# Patient Record
Sex: Female | Born: 1980
Health system: Southern US, Community
[De-identification: ages and names within clinical notes are randomized; demographics above are authoritative.]

## PROBLEM LIST (undated history)

## (undated) DIAGNOSIS — F419 Anxiety disorder, unspecified: Secondary | ICD-10-CM

## (undated) DIAGNOSIS — R51 Headache: Secondary | ICD-10-CM

## (undated) DIAGNOSIS — J45909 Unspecified asthma, uncomplicated: Secondary | ICD-10-CM

## (undated) DIAGNOSIS — T7840XA Allergy, unspecified, initial encounter: Secondary | ICD-10-CM

## (undated) DIAGNOSIS — R519 Headache, unspecified: Secondary | ICD-10-CM

## (undated) HISTORY — DX: Unspecified asthma, uncomplicated: J45.909

## (undated) HISTORY — DX: Headache, unspecified: R51.9

## (undated) HISTORY — DX: Anxiety disorder, unspecified: F41.9

## (undated) HISTORY — DX: Headache: R51

## (undated) HISTORY — DX: Allergy, unspecified, initial encounter: T78.40XA

---
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
## 2000-01-26 ENCOUNTER — Inpatient Hospital Stay (HOSPITAL_COMMUNITY): Admission: AD | Admit: 2000-01-26 | Discharge: 2000-01-26 | Payer: Self-pay | Admitting: Obstetrics and Gynecology

## 2000-02-21 ENCOUNTER — Inpatient Hospital Stay (HOSPITAL_COMMUNITY): Admission: AD | Admit: 2000-02-21 | Discharge: 2000-02-24 | Payer: Self-pay | Admitting: Obstetrics and Gynecology

## 2000-02-25 ENCOUNTER — Encounter: Admission: RE | Admit: 2000-02-25 | Discharge: 2000-03-28 | Payer: Self-pay | Admitting: Obstetrics and Gynecology

## 2000-03-30 ENCOUNTER — Other Ambulatory Visit: Admission: RE | Admit: 2000-03-30 | Discharge: 2000-03-30 | Payer: Self-pay | Admitting: Obstetrics and Gynecology

## 2000-05-06 ENCOUNTER — Encounter: Payer: Self-pay | Admitting: Obstetrics and Gynecology

## 2000-05-06 ENCOUNTER — Ambulatory Visit (HOSPITAL_COMMUNITY): Admission: RE | Admit: 2000-05-06 | Discharge: 2000-05-06 | Payer: Self-pay | Admitting: Obstetrics and Gynecology

## 2001-07-27 ENCOUNTER — Other Ambulatory Visit: Admission: RE | Admit: 2001-07-27 | Discharge: 2001-07-27 | Payer: Self-pay | Admitting: Obstetrics and Gynecology

## 2004-04-03 ENCOUNTER — Other Ambulatory Visit: Admission: RE | Admit: 2004-04-03 | Discharge: 2004-04-03 | Payer: Self-pay | Admitting: Obstetrics and Gynecology

## 2004-11-12 ENCOUNTER — Other Ambulatory Visit: Admission: RE | Admit: 2004-11-12 | Discharge: 2004-11-12 | Payer: Self-pay | Admitting: Obstetrics and Gynecology

## 2005-04-06 ENCOUNTER — Other Ambulatory Visit: Admission: RE | Admit: 2005-04-06 | Discharge: 2005-04-06 | Payer: Self-pay | Admitting: Obstetrics and Gynecology

## 2005-09-17 ENCOUNTER — Other Ambulatory Visit: Admission: RE | Admit: 2005-09-17 | Discharge: 2005-09-17 | Payer: Self-pay | Admitting: Obstetrics and Gynecology

## 2010-02-15 HISTORY — PX: REPAIR PERONEAL TENDONS ANKLE: SUR1201

## 2010-07-21 ENCOUNTER — Ambulatory Visit: Payer: Self-pay | Admitting: Family Medicine

## 2010-11-19 ENCOUNTER — Ambulatory Visit (HOSPITAL_BASED_OUTPATIENT_CLINIC_OR_DEPARTMENT_OTHER)
Admission: RE | Admit: 2010-11-19 | Discharge: 2010-11-19 | Disposition: A | Discharge status: Home / Self Care | Payer: 59 | Source: Ambulatory Visit | Attending: Orthopedic Surgery | Admitting: Orthopedic Surgery

## 2010-11-19 DIAGNOSIS — Z01812 Encounter for preprocedural laboratory examination: Secondary | ICD-10-CM | POA: Insufficient documentation

## 2010-11-19 DIAGNOSIS — J449 Chronic obstructive pulmonary disease, unspecified: Secondary | ICD-10-CM | POA: Insufficient documentation

## 2010-11-19 DIAGNOSIS — M659 Synovitis and tenosynovitis, unspecified: Secondary | ICD-10-CM | POA: Insufficient documentation

## 2010-11-19 DIAGNOSIS — M24176 Other articular cartilage disorders, unspecified foot: Secondary | ICD-10-CM | POA: Insufficient documentation

## 2010-11-19 DIAGNOSIS — Z87891 Personal history of nicotine dependence: Secondary | ICD-10-CM | POA: Insufficient documentation

## 2010-11-19 DIAGNOSIS — M65979 Unspecified synovitis and tenosynovitis, unspecified ankle and foot: Secondary | ICD-10-CM | POA: Insufficient documentation

## 2010-11-19 DIAGNOSIS — M249 Joint derangement, unspecified: Secondary | ICD-10-CM | POA: Insufficient documentation

## 2010-11-19 DIAGNOSIS — J4489 Other specified chronic obstructive pulmonary disease: Secondary | ICD-10-CM | POA: Insufficient documentation

## 2010-11-19 LAB — POCT HEMOGLOBIN-HEMACUE: Hemoglobin: 13 g/dL (ref 12.0–15.0)

## 2010-11-23 NOTE — Op Note (Signed)
Gabriela Alvarado, Gabriela Alvarado                ACCOUNT NO.:  [id number]  MEDICAL RECORD NO.:  [id number]  LOCATION:                                 FACILITY:  PHYSICIAN:  Toni Arthurs, MD             DATE OF BIRTH:  DATE OF PROCEDURE:  11/19/2010 DATE OF DISCHARGE:                              OPERATIVE REPORT   PREOPERATIVE DIAGNOSIS:  Left peroneal tendon tear.  POSTOPERATIVE DIAGNOSES: 1. Left ankle peroneus brevis tendon tear. 2. Left peroneus brevis and peroneus longus tenosynovitis. 3. Left peroneus quartus. 4. Prominent left peroneal tubercle.  PROCEDURES: 1. Left peroneus longus tenolysis. 2. Left peroneus brevis tenolysis. 3. Left peroneus brevis tendon repair. 4. Exostectomy of the left peroneal tubercle from the lateral wall of     the calcaneus. 5. Transfer of peroneus quartus to peroneus longus.  SURGEON:  Toni Arthurs, MD  ANESTHESIA:  General, regional.  IV FLUIDS:  See anesthesia record.  ESTIMATED BLOOD LOSS:  Minimal.  TOURNIQUET TIME:  Fifty seven minutes at 250 mmHg.  COMPLICATIONS:  None apparent.  DISPOSITION:  Extubated awake and stable to recovery.  INDICATIONS FOR PROCEDURE:  The patient is a 30 year old female who complains of left lateral ankle pain for more than a year.  She has failed treatment with physical therapy, activity modification, shoe wear modification, bracing.  Signs and symptoms are consistent with a peroneal tendon tear.  She presents now for operative treatment of this condition.  She understands the risks and benefits of this procedure as well as the  alternative treatment options and would like to proceed. Specifically, she understands risks of bleeding, infection, nerve damage, blood clots, need for additional surgery, amputation, and death.  PROCEDURE IN DETAIL:  After preoperative consent was obtained and the correct operative site was identified, the patient was brought to the operating room and placed supine on the operating  table.  General anesthesia was induced.  Preoperative antibiotics were administered. Surgical time-out was taken.  The patient was then turned in the lateral decubitus position with the left side up.  The left lower extremity was prepped and draped in standard sterile fashion with a tourniquet around the thigh.  An incision was marked on the skin overlying the peroneal tendon sheath.  The extremity was exsanguinated and the tourniquet was inflated to 250 mmHg.  The previously marked incision was made and sharp dissection was carried down through the skin and subcutaneous tissue, taking care to protect the crossing branches of the sural nerve.  The peroneal tendon sheath was incised and copious synovial fluid was immediately evident.  Both the peroneus brevis and peroneus longus tendons were noted to have significant tenosynovitis along their full length.  Also present was a very prominent peroneal tubercle off the lateral wall of the calcaneus.  The peroneus brevis was noted to have several degenerative longitudinal split tears in the central portion of the tendon.  Also noted posteriorly was a peroneus quartus.  This did not have a particularly low-lying muscle belly, though.  The synovial fluid was all cleaned out.  All the tenosynovitis was sharply excised along the tendons proximally and distally.  The degenerated portion of the peroneus brevis tendon was excised sharply.  The remaining healthy fibers were repaired side-to-side with buried horizontal mattress sutures of 2-0 Vicryl.  Running 2-0 Vicryl sutures were then used to close the splits in the tendon anteriorly and posteriorly.  The retrofibular groove was noted to be of appropriate depth and there was no evidence of subluxation or dislocation of the peroneal tendons.  The prominent peroneal tubercle was excised with a rongeur.  The peroneus quartus tendon was detached from its insertion on the posterior tendon sheath and  transferred to the peroneus longus tendon with a Pulvertaft weave.  The wound was irrigated copiously.  Three drill holes were made in the posterior aspect of the edge of the fibula.  3-0 Ethibond sutures were then passed through the drill holes repairing the superior peroneal retinaculum back to its original position on the fibula.  The periosteum was then repaired over the drill holes to the retinaculum with simple sutures of 2-0 Vicryl.  The remainder of the peroneal tendon sheath was closed in watertight fashion with 2-0 Vicryl sutures.  Subcutaneous tissue was closed with inverted simple sutures of 4-0 Monocryl.  Skin was closed with a running 3-0 Prolene suture.  Sterile dressings were applied followed by a well-padded short-leg cast.  The tourniquet was released at 59 minutes.  The patient was then awakened from anesthesia and transported to the recovery room in stable condition.  FOLLOWUP PLAN:  The patient will be nonweightbearing on her left lower extremity for 2 weeks.  She will follow up with me at that point for suture removal and conversion to either a walking cast or a Cam walker boot.     Toni Arthurs, MD     JH/MEDQ  D:  11/19/2010  T:  11/19/2010  Job:  [id number]  Electronically Signed by Jonny Ruiz Seriyah Collison  on 11/23/2010 08:56:08 AM

## 2011-07-14 ENCOUNTER — Ambulatory Visit (INDEPENDENT_AMBULATORY_CARE_PROVIDER_SITE_OTHER): Payer: 59 | Admitting: Physician Assistant

## 2011-07-14 ENCOUNTER — Telehealth: Payer: Self-pay

## 2011-07-14 VITALS — BP 110/64 | HR 72 | Temp 98.3°F | Resp 18 | Ht 71.5 in | Wt 197.0 lb

## 2011-07-14 DIAGNOSIS — R300 Dysuria: Secondary | ICD-10-CM

## 2011-07-14 DIAGNOSIS — N390 Urinary tract infection, site not specified: Secondary | ICD-10-CM

## 2011-07-14 DIAGNOSIS — R350 Frequency of micturition: Secondary | ICD-10-CM

## 2011-07-14 LAB — POCT UA - MICROSCOPIC ONLY
Casts, Ur, LPF, POC: NEGATIVE
Crystals, Ur, HPF, POC: NEGATIVE
Mucus, UA: NEGATIVE
Yeast, UA: NEGATIVE

## 2011-07-14 LAB — POCT URINALYSIS DIPSTICK
Bilirubin, UA: NEGATIVE
Glucose, UA: NEGATIVE
Nitrite, UA: NEGATIVE

## 2011-07-14 MED ORDER — PHENAZOPYRIDINE HCL 100 MG PO TABS: 100.0000 mg | ORAL_TABLET | Freq: Three times a day (TID) | ORAL | Status: AC | PRN

## 2011-07-14 MED ORDER — NITROFURANTOIN MONOHYD MACRO 100 MG PO CAPS: 100.0000 mg | ORAL_CAPSULE | Freq: Two times a day (BID) | ORAL | Status: AC

## 2011-07-14 NOTE — Telephone Encounter (Signed)
Pt calling for lab results. States they were taken by urine sample. Pt anxious.  Best: [phone]  bf

## 2011-07-14 NOTE — Progress Notes (Signed)
Patient ID: Gabriela Alvarado MRN: [id number], DOB: 06-Jun-1980, 31 y.o. Date of Encounter: 07/14/2011, 10:13 AM  Primary Physician: No primary provider on file.  Chief Complaint: Dysuria, urinary frequency and urgency  HPI: 31 y.o. year old female with history below presents with a one day history of dysuria, urinary frequency, and urinary urgency. Afebrile. No chills. No nausea or vomiting. No flank or low back pain. Mild suprapubic fullness, otherwise no abdominal pain. History of Chlamydia several years prior, successfully treated. Sexually active. New female partner one week prior. No condom use. No vaginal complaints. LMP 10 years prior, secondary to contraception.    No past medical history on file.   Home Meds: Prior to Admission medications   Medication Sig Start Date End Date Taking? Authorizing Provider  escitalopram (LEXAPRO) 10 MG tablet Take 10 mg by mouth daily.   Yes Historical Provider, MD  levonorgestrel (MIRENA) 20 MCG/24HR IUD 1 each by Intrauterine route once.   Yes Historical Provider, MD                  Allergies:  Allergies  Allergen Reactions  . Antihistamines, Chlorpheniramine-Type     History   Social History  . Marital Status: Single    Spouse Name: N/A    Number of Children: N/A  . Years of Education: N/A   Occupational History  . Not on file.   Social History Main Topics  . Smoking status: Current Everyday Smoker  . Smokeless tobacco: Not on file  . Alcohol Use: Not on file  . Drug Use: Not on file  . Sexually Active: Not on file   Other Topics Concern  . Not on file   Social History Narrative  . No narrative on file     Review of Systems: Constitutional: negative for chills, fever, night sweats, weight changes, or fatigue  HEENT: negative for vision changes or hearing loss Cardiovascular: negative for chest pain or palpitations Respiratory: negative for hemoptysis, wheezing, shortness of breath, or cough Abdominal: negative for  abdominal pain, nausea, vomiting, diarrhea, or constipation Genitourinary: negative for abnormal vaginal bleeding, discharge, burning, pruritis, menopause symptoms, or pelvic pain Dermatological: negative for rash Neurologic: negative for headache, dizziness, or syncope All other systems reviewed and are otherwise negative with the exception to those above and in the HPI.   Physical Exam: Blood pressure 110/64, pulse 72, temperature 98.3 F (36.8 C), temperature source Oral, resp. rate 18, height 5' 11.5" (1.816 m), weight 197 lb (89.359 kg), last menstrual period 07/13/2001., Body mass index is 27.09 kg/(m^2). General: Well developed, well nourished, in no acute distress. Head: Normocephalic, atraumatic, eyes without discharge, sclera non-icteric, nares are without discharge. Bilateral auditory canals clear, TM's are without perforation, pearly grey and translucent with reflective cone of light bilaterally. Oral cavity moist, posterior pharynx without exudate, erythema, peritonsillar abscess, or post nasal drip.  Neck: Supple. No thyromegaly. Full ROM. No lymphadenopathy. Lungs: Clear bilaterally to auscultation without wheezes, rales, or rhonchi. Breathing is unlabored. Heart: RRR with S1 S2. No murmurs, rubs, or gallops appreciated. Abdomen: Soft, non-tender, non-distended with normoactive bowel sounds. Mild suprapubic fullness to palpation, creates sensation of needing to void. No hepatosplenomegaly. No rebound/guarding. No obvious abdominal masses. No CVA tenderness. Negative McBurney's, Rovsing's, Iliopsoas, and table jar test. Msk:  Strength and tone normal for age. Extremities/Skin: Warm and dry. No clubbing or cyanosis. No edema. No rashes or suspicious lesions. Neuro: Alert and oriented X 3. Moves all extremities spontaneously. Gait is normal. CNII-XII  grossly in tact. Psych:  Responds to questions appropriately with a normal affect.   Labs: Results for orders placed in visit on  07/14/11  POCT UA - MICROSCOPIC ONLY      Component Value Range   WBC, Ur, HPF, POC 4-10     RBC, urine, microscopic 2-6     Bacteria, U Microscopic 1+     Mucus, UA neg     Epithelial cells, urine per micros 1-3     Crystals, Ur, HPF, POC neg     Casts, Ur, LPF, POC neg     Yeast, UA neg    POCT URINALYSIS DIPSTICK      Component Value Range   Color, UA yellow     Clarity, UA sl cloudy     Glucose, UA neg     Bilirubin, UA neg     Ketones, UA neg     Spec Grav, UA 1.015     Blood, UA small     pH, UA 7.0     Protein, UA neg     Urobilinogen, UA 0.2     Nitrite, UA neg     Leukocytes, UA moderate (2+)    POCT URINE PREGNANCY      Component Value Range   Preg Test, Ur Negative     UC and uriprobe pending Declines full STD cehck  ASSESSMENT AND PLAN:  31 y.o. year old female with UTI. -Macrobid 100mg  1 po bid #14 no RF -Pyridium 100 mg #6 1 po tid prn no RF SED -Push fluids -Await culture results -Await uriprobe results, okay to leave detailed message -RTC precautions  Signed, Eula Listen, PA-C 07/14/2011 10:13 AM

## 2011-07-15 LAB — URINE CULTURE
Colony Count: NO GROWTH
Organism ID, Bacteria: NO GROWTH

## 2011-07-15 LAB — GC/CHLAMYDIA PROBE AMP, URINE: Chlamydia, Swab/Urine, PCR: NEGATIVE

## 2011-07-17 NOTE — Telephone Encounter (Signed)
Pt given results 07/16/11

## 2013-12-11 ENCOUNTER — Ambulatory Visit (INDEPENDENT_AMBULATORY_CARE_PROVIDER_SITE_OTHER): Payer: BC Managed Care – PPO | Admitting: Family Medicine

## 2013-12-11 VITALS — BP 122/80 | HR 117 | Temp 98.3°F | Resp 16 | Ht 72.0 in | Wt 222.0 lb

## 2013-12-11 DIAGNOSIS — R059 Cough, unspecified: Secondary | ICD-10-CM

## 2013-12-11 DIAGNOSIS — J0100 Acute maxillary sinusitis, unspecified: Secondary | ICD-10-CM

## 2013-12-11 DIAGNOSIS — R05 Cough: Secondary | ICD-10-CM

## 2013-12-11 DIAGNOSIS — J209 Acute bronchitis, unspecified: Secondary | ICD-10-CM

## 2013-12-11 MED ORDER — AZITHROMYCIN 250 MG PO TABS: ORAL_TABLET | ORAL | Status: DC

## 2013-12-11 MED ORDER — HYDROCODONE-HOMATROPINE 5-1.5 MG/5ML PO SYRP
5.0000 mL | ORAL_SOLUTION | Freq: Three times a day (TID) | ORAL | Status: DC | PRN
Start: 2013-12-11 — End: 2013-12-30

## 2013-12-11 NOTE — Progress Notes (Signed)
Patient ID: Gabriela Alvarado, female   DOB: 1980-11-13, 33 y.o.   MRN: [id number]  This chart was scribed for Elvina SidleKurt Keimani Laufer, MD by Charline BillsEssence Howell, ED Scribe. The patient was seen in room 3. Patient's care was started at 2:23 PM.  Patient ID: Gabriela Alvarado MRN: [id number], DOB: 1980-11-13, 33 y.o. Date of Encounter: 12/11/2013, 2:23 PM  Primary Physician: No primary provider on file.  Chief Complaint  Patient presents with  . Headache    x 1 week  . Cough    congestion x 1 week  . Generalized Body Aches    x 1 weel  . Fatigue    x 1 week   HPI: 33 y.o. year old female with history below presents with cough onset 1 week ago. She reports associated congestion, generalized body aches, fatigue and HA onset 1 week ago, ear pain at night. She reports ST that has resolved. She denies fever, night sweats, SOB. No h/o asthma. No inhaler use. No sick contacts. Pt is a smoker; she quit for 10 months but started smoking again 1 month ago.   Immunizations Pt had flu vaccine at work a few weeks ago.  Pt works at [employer].   Past Medical History  Diagnosis Date  . Allergy   . Asthma      Home Meds: Prior to Admission medications   Medication Sig Start Date End Date Taking? Authorizing Provider  escitalopram (LEXAPRO) 10 MG tablet Take 10 mg by mouth daily.   Yes Historical Provider, MD  levonorgestrel (MIRENA) 20 MCG/24HR IUD 1 each by Intrauterine route once.   Yes Historical Provider, MD    Allergies:  Allergies  Allergen Reactions  . Antihistamines, Chlorpheniramine-Type     History   Social History  . Marital Status: Single    Spouse Name: N/A    Number of Children: N/A  . Years of Education: N/A   Occupational History  . Not on file.   Social History Main Topics  . Smoking status: Current Every Day Smoker  . Smokeless tobacco: Not on file  . Alcohol Use: Not on file  . Drug Use: Not on file  . Sexual Activity: Not on file   Other Topics Concern  . Not on file    Social History Narrative  . No narrative on file     Review of Systems: Constitutional: negative for fever, night sweats, chills, or weight changes, +fatigue  HEENT: negative for vision changes, hearing loss, rhinorrhea, epistaxis, or sinus pressure, +congestion, +ST Cardiovascular: negative for chest pain or palpitations Respiratory: negative for shortness of breath, hemoptysis, wheezing, +cough Abdominal: negative for abdominal pain, nausea, vomiting, diarrhea, or constipation Msk: +myalgias Dermatological: negative for rash Neurologic: negative for dizziness, or syncope, +headache All other systems reviewed and are otherwise negative with the exception to those above and in the HPI.  Physical Exam: Triage Vitals: Blood pressure 122/80, pulse 117, temperature 98.3 F (36.8 C), temperature source Oral, resp. rate 16, height 6' (1.829 m), weight 222 lb (100.699 kg), SpO2 99.00%., Body mass index is 30.1 kg/(m^2). General: Well developed, well nourished, in no acute distress. Head: Normocephalic, atraumatic, eyes without discharge, sclera non-icteric, nares are without discharge. Bilateral auditory canals clear, TM's are without perforation, pearly grey and translucent with reflective cone of light bilaterally. Oral cavity moist, posterior pharynx without exudate, erythema, peritonsillar abscess, or post nasal drip.  Neck: Supple. No thyromegaly. Full ROM. No lymphadenopathy. Lungs: No wheezes or rhonchi. Few basilar rales. Heart: RRR  with S1 S2. No murmurs, rubs, or gallops appreciated. Abdomen: Soft, non-tender, non-distended with normoactive bowel sounds. No hepatomegaly. No rebound/guarding. No obvious abdominal masses. Msk:  Strength and tone normal for age. Extremities/Skin: Warm and dry. No clubbing or cyanosis. No edema. No rashes or suspicious lesions. Neuro: Alert and oriented X 3. Moves all extremities spontaneously. Gait is normal. CNII-XII grossly in tact. Psych:  Responds  to questions appropriately with a normal affect.     ASSESSMENT AND PLAN:  33 y.o. year old female with  Cough - Plan: HYDROcodone-homatropine (HYCODAN) 5-1.5 MG/5ML syrup  Acute bronchitis, unspecified organism - Plan: azithromycin (ZITHROMAX Z-PAK) 250 MG tablet  Acute maxillary sinusitis, recurrence not specified - Plan: azithromycin (ZITHROMAX Z-PAK) 250 MG tablet   I personally performed the services described in this documentation, which was scribed in my presence. The recorded information has been reviewed and is accurate.  Signed, Elvina SidleKurt Joselyne Spake, MD 12/11/2013 2:23 PM

## 2013-12-11 NOTE — Patient Instructions (Signed)

## 2013-12-30 ENCOUNTER — Ambulatory Visit (INDEPENDENT_AMBULATORY_CARE_PROVIDER_SITE_OTHER): Payer: BC Managed Care – PPO | Admitting: Family Medicine

## 2013-12-30 VITALS — BP 100/70 | HR 105 | Temp 98.4°F | Resp 16 | Ht 72.0 in | Wt 223.4 lb

## 2013-12-30 DIAGNOSIS — R350 Frequency of micturition: Secondary | ICD-10-CM

## 2013-12-30 DIAGNOSIS — N3001 Acute cystitis with hematuria: Secondary | ICD-10-CM

## 2013-12-30 LAB — POCT UA - MICROSCOPIC ONLY
Casts, Ur, LPF, POC: NEGATIVE
Crystals, Ur, HPF, POC: NEGATIVE
Mucus, UA: NEGATIVE
Yeast, UA: NEGATIVE

## 2013-12-30 LAB — POCT URINALYSIS DIPSTICK
GLUCOSE UA: 100
NITRITE UA: POSITIVE
Protein, UA: 100
Spec Grav, UA: >=1.03
UROBILINOGEN UA: 1
pH, UA: 5

## 2013-12-30 MED ORDER — NITROFURANTOIN MONOHYD MACRO 100 MG PO CAPS: 100.0000 mg | ORAL_CAPSULE | Freq: Two times a day (BID) | ORAL | Status: DC

## 2013-12-30 NOTE — Patient Instructions (Signed)

## 2013-12-30 NOTE — Progress Notes (Signed)
Subjective:    Patient ID: Gabriela Alvarado, female    DOB: 1980-09-25, 33 y.o.   MRN: [id number] This chart was scribed for Norberto SorensonEva Brenan Modesto, MD by [place]il[place]Jordan Peace, ED Scribe. The patient was seen in RM09. The patient's care was started at 4:22 PM.   HPI HPI Comments: Gabriela Alvarado is a 33 y.o. female who [facility] complaining of UTI symptoms onset Friday with increased frequency and dysuria. Pt states that she hasn't had any UTI's recently but reports normal history of them in the past. She denies any recent douching or bubble bath. No complaints of back pain, vaginal discharge, fever, or chills.    Past Medical History  Diagnosis Date  . Allergy   . Asthma    Current Outpatient Prescriptions on File Prior to Visit  Medication Sig Dispense Refill  . escitalopram (LEXAPRO) 10 MG tablet Take 10 mg by mouth daily.    Marland Kitchen. levonorgestrel (MIRENA) 20 MCG/24HR IUD 1 each by Intrauterine route once.     No current facility-administered medications on file prior to visit.   Allergies  Allergen Reactions  . Antihistamines, Chlorpheniramine-Type      Review of Systems  Constitutional: Negative for fever and chills.  Genitourinary: Positive for dysuria and frequency. Negative for vaginal discharge.  Musculoskeletal: Negative for back pain.       Objective:  Physical Exam  Constitutional: She is oriented to person, place, and time. She appears well-developed and well-nourished. No distress.  HENT:  Head: Normocephalic and atraumatic.  Eyes: Conjunctivae and EOM are normal.  Neck: Neck supple. No tracheal deviation present.  Cardiovascular: Normal rate, regular rhythm and normal heart sounds.   Pulmonary/Chest: Effort normal and breath sounds normal. No respiratory distress.  Abdominal: Soft. Bowel sounds are normal. There is no CVA tenderness.  Musculoskeletal: Normal range of motion.  Neurological: She is alert and oriented to person, place, and time.  Skin: Skin is warm and dry.   Psychiatric: She has a normal mood and affect. Her behavior is normal.  Nursing note and vitals reviewed.  BP 100/70 mmHg  Pulse 105  Temp(Src) 98.4 F (36.9 C) (Oral)  Resp 16  Ht 6' (1.829 m)  Wt 223 lb 6 oz (101.322 kg)  BMI 30.29 kg/m2  SpO2 99%   Results for orders placed or performed in visit on 12/30/13  POCT urinalysis dipstick  Result Value Ref Range   Color, UA orange    Clarity, UA clear    Glucose, UA 100    Bilirubin, UA small    Ketones, UA trace    Spec Grav, UA >=1.030    Blood, UA moderate    pH, UA 5.0    Protein, UA 100    Urobilinogen, UA 1.0    Nitrite, UA positive    Leukocytes, UA Trace   POCT UA - Microscopic Only  Result Value Ref Range   WBC, Ur, HPF, POC 25-30    RBC, urine, microscopic tntc    Bacteria, U Microscopic 1+    Mucus, UA neg    Epithelial cells, urine per micros tntc    Crystals, Ur, HPF, POC neg    Casts, Ur, LPF, POC neg    Yeast, UA neg         Assessment & Plan:   Urinary frequency - Plan: POCT urinalysis dipstick, POCT UA - Microscopic Only  Acute cystitis with hematuria - Plan: Urine culture  Meds ordered this encounter  Medications  .  nitrofurantoin, macrocrystal-monohydrate, (MACROBID) 100 MG capsule    Sig: Take 1 capsule (100 mg total) by mouth 2 (two) times daily.    Dispense:  14 capsule    Refill:  0    I personally performed the services described in this documentation, which was scribed in my presence. The recorded information has been reviewed and considered, and addended by me as needed.  Norberto SorensonEva Kydan Shanholtzer, MD MPH

## 2014-01-02 LAB — URINE CULTURE

## 2014-01-16 ENCOUNTER — Ambulatory Visit (INDEPENDENT_AMBULATORY_CARE_PROVIDER_SITE_OTHER): Payer: BC Managed Care – PPO | Admitting: Family Medicine

## 2014-01-16 VITALS — BP 114/70 | HR 98 | Temp 98.4°F | Resp 16 | Ht 71.5 in | Wt 220.0 lb

## 2014-01-16 DIAGNOSIS — R309 Painful micturition, unspecified: Secondary | ICD-10-CM

## 2014-01-16 LAB — POCT URINALYSIS DIPSTICK
Bilirubin, UA: NEGATIVE
GLUCOSE UA: NEGATIVE
KETONES UA: NEGATIVE
Nitrite, UA: NEGATIVE
Protein, UA: NEGATIVE
SPEC GRAV UA: 1.015
UROBILINOGEN UA: 0.2
pH, UA: 5.5

## 2014-01-16 LAB — POCT UA - MICROSCOPIC ONLY
Casts, Ur, LPF, POC: NEGATIVE
Crystals, Ur, HPF, POC: NEGATIVE
Mucus, UA: NEGATIVE
Yeast, UA: NEGATIVE

## 2014-01-16 MED ORDER — CIPROFLOXACIN HCL 500 MG PO TABS
500.0000 mg | ORAL_TABLET | Freq: Two times a day (BID) | ORAL | Status: DC
Start: 2014-01-16 — End: 2016-07-23

## 2014-01-16 NOTE — Progress Notes (Addendum)
This chart was scribed for Gabriela SidleKurt Lauenstein, MD by Tonye RoyaltyJoshua Chen, ED Scribe.  Patient ID: Gabriela Alvarado MRN: [id number], DOB: [birth year], 33 y.o. Date of Encounter: 01/16/2014, 1:58 PM  Primary Physician: No PCP Per Patient  Chief Complaint: dysuria  HPI: 33 y.o. year old female with history below for dysuria with onset 1 week ago. She was evaluate here for UTI 2 weeks ago, but states her symptoms now seem different. She states that she had dysuria and frequency from which she improved and was asymptomatic for 3 days, but then started having dysuria again. She states she has back pain as well but believes it is due to her period. She denies hematuria.   Past Medical History  Diagnosis Date   Allergy    Asthma      Home Meds: Prior to Admission medications   Medication Sig Start Date End Date Taking? Authorizing Provider  escitalopram (LEXAPRO) 10 MG tablet Take 10 mg by mouth daily.   Yes Historical Provider, MD  levonorgestrel (MIRENA) 20 MCG/24HR IUD 1 each by Intrauterine route once.   Yes Historical Provider, MD    Allergies:  Allergies  Allergen Reactions   Antihistamines, Chlorpheniramine-Type     History   Social History   Marital Status: Single    Spouse Name: N/A    Number of Children: N/A   Years of Education: N/A   Occupational History   Not on file.   Social History Main Topics   Smoking status: Current Every Day Smoker   Smokeless tobacco: Never Used   Alcohol Use: 0.0 oz/week    0 Not specified per week   Drug Use: No   Sexual Activity: Not on file   Other Topics Concern   Not on file   Social History Narrative     Review of Systems: Constitutional: negative for chills, fever, night sweats, weight changes, or fatigue  HEENT: negative for vision changes, hearing loss, congestion, rhinorrhea, ST, epistaxis, or sinus pressure Cardiovascular: negative for chest pain or palpitations Respiratory: negative for hemoptysis, wheezing,  shortness of breath, or cough Abdominal: negative for abdominal pain, nausea, vomiting, diarrhea, or constipation Dermatological: negative for rash Neurologic: negative for headache, dizziness, or syncope GU: negative hematuria, Positive dysuria, back pain All other systems reviewed and are otherwise negative with the exception to those above and in the HPI.   Physical Exam: Blood pressure 114/70, pulse 98, temperature 98.4 F (36.9 C), temperature source Oral, resp. rate 16, height 5' 11.5" (1.816 m), weight 220 lb (99.791 kg), SpO2 99 %., Body mass index is 30.26 kg/(m^2). General: Well developed, well nourished, in no acute distress. Head: Normocephalic, atraumatic, eyes without discharge, sclera non-icteric, nares are without discharge. Bilateral auditory canals clear, TM's are without perforation, pearly grey and translucent with reflective cone of light bilaterally. Oral cavity moist, posterior pharynx without exudate, erythema, peritonsillar abscess, or post nasal drip.  Neck: Supple. No thyromegaly. Full ROM. No lymphadenopathy. Lungs: Clear bilaterally to auscultation without wheezes, rales, or rhonchi. Breathing is unlabored. Heart: RRR with S1 S2. No murmurs, rubs, or gallops appreciated. Abdomen: Soft, non-tender, non-distended with normoactive bowel sounds. No hepatomegaly. No rebound/guarding. No obvious abdominal masses. Msk:  Strength and tone normal for age. Extremities/Skin: Warm and dry. No clubbing or cyanosis. No edema. No rashes or suspicious lesions. Neuro: Alert and oriented X 3. Moves all extremities spontaneously. Gait is normal. CNII-XII grossly in tact. Psych:  Responds to questions appropriately with a normal affect.   Labs: Results  for orders placed or performed in visit on 01/16/14  POCT urinalysis dipstick  Result Value Ref Range   Color, UA yellow    Clarity, UA cloudy    Glucose, UA Negative    Bilirubin, UA Negative    Ketones, UA negative    Spec  Grav, UA 1.015    Blood, UA Trace    pH, UA 5.5    Protein, UA Negative    Urobilinogen, UA 0.2    Nitrite, UA negative    Leukocytes, UA moderate (2+)   POCT UA - Microscopic Only  Result Value Ref Range   WBC, Ur, HPF, POC 0-7    RBC, urine, microscopic 0-7    Bacteria, U Microscopic 2+    Mucus, UA negative    Epithelial cells, urine per micros 0-2    Crystals, Ur, HPF, POC Negative    Casts, Ur, LPF, POC Negative    Yeast, UA Negative       ASSESSMENT AND PLAN:  33 y.o. year old female with Pain with urination - Plan: POCT urinalysis dipstick, POCT UA - Microscopic Only, ciprofloxacin (CIPRO) 500 MG tablet  Signed, Gabriela SidleKurt Lauenstein, MD 01/16/2014 1:58 PM   This chart was scribed in my presence and reviewed by me personally.    ICD-9-CM ICD-10-CM   1. Pain with urination 788.1 R30.9 POCT urinalysis dipstick     POCT UA - Microscopic Only     ciprofloxacin (CIPRO) 500 MG tablet     Signed, Gabriela SidleKurt Lauenstein, MD

## 2014-01-16 NOTE — Patient Instructions (Addendum)
Urinary Tract Infection Urinary tract infections (UTIs) can develop anywhere along your urinary tract. Your urinary tract is your body's drainage system for removing wastes and extra water. Your urinary tract includes two kidneys, two ureters, a bladder, and a urethra. Your kidneys are a pair of bean-shaped organs. Each kidney is about the size of your fist. They are located below your ribs, one on each side of your spine. CAUSES Infections are caused by microbes, which are microscopic organisms, including fungi, viruses, and bacteria. These organisms are so small that they can only be seen through a microscope. Bacteria are the microbes that most commonly cause UTIs. SYMPTOMS  Symptoms of UTIs may vary by age and gender of the patient and by the location of the infection. Symptoms in young women typically include a frequent and intense urge to urinate and a painful, burning feeling in the bladder or urethra during urination. Older women and men are more likely to be tired, shaky, and weak and have muscle aches and abdominal pain. A fever may mean the infection is in your kidneys. Other symptoms of a kidney infection include pain in your back or sides below the ribs, nausea, and vomiting. DIAGNOSIS To diagnose a UTI, your caregiver will ask you about your symptoms. Your caregiver also will ask to provide a urine sample. The urine sample will be tested for bacteria and white blood cells. White blood cells are made by your body to help fight infection. TREATMENT  Typically, UTIs can be treated with medication. Because most UTIs are caused by a bacterial infection, they usually can be treated with the use of antibiotics. The choice of antibiotic and length of treatment depend on your symptoms and the type of bacteria causing your infection. HOME CARE INSTRUCTIONS  If you were prescribed antibiotics, take them exactly as your caregiver instructs you. Finish the medication even if you feel better after you  have only taken some of the medication.  Drink enough water and fluids to keep your urine clear or pale yellow.  Avoid caffeine, tea, and carbonated beverages. They tend to irritate your bladder.  Empty your bladder often. Avoid holding urine for long periods of time.  Empty your bladder before and after sexual intercourse.  After a bowel movement, women should cleanse from front to back. Use each tissue only once. SEEK MEDICAL CARE IF:   You have back pain.  You develop a fever.  Your symptoms do not begin to resolve within 3 days. SEEK IMMEDIATE MEDICAL CARE IF:   You have severe back pain or lower abdominal pain.  You develop chills.  You have nausea or vomiting.  You have continued burning or discomfort with urination. MAKE SURE YOU:   Understand these instructions.  Will watch your condition.  Will get help right away if you are not doing well or get worse. Document Released: 11/11/2004 Document Revised: 08/03/2011 Document Reviewed: 03/12/2011 [facility] Patient Information 2015 [employer], [place]. This information is not intended to replace advice given to you by your health care provider. Make sure you discuss any questions you have with your health care provider.     2wk ago    Culture ESCHERICHIA COLI   Colony Count >=100,000 COLONIES/ML   Organism ID, Bacteria ESCHERICHIA COLI   Resulting Agency [facility]    Culture & Susceptibility      ESCHERICHIA COLI     Antibiotic Sensitivity Microscan Status    AMOX/CLAVULANIC Sensitive <=2 Final    AMPICILLIN Sensitive <=2 Final  AMPICILLIN/SULBACTAM Sensitive <=2 Final    CEFAZOLIN Sensitive <=4 Final    CEFEPIME Sensitive <=1 Final    CEFTAZIDIME Sensitive <=1 Final    CEFTRIAXONE Sensitive <=1 Final    CIPROFLOXACIN Sensitive <=0.25 Final    GENTAMICIN Sensitive <=1 Final    IMIPENEM Sensitive <=0.25 Final    LEVOFLOXACIN Sensitive <=0.12 Final    NITROFURANTOIN Sensitive  <=16 Final    PIP/TAZO Sensitive <=4 Final    TOBRAMYCIN Sensitive <=1 Final    TRIMETH/SULFA Sensitive <=20 Final

## 2016-07-02 DIAGNOSIS — R413 Other amnesia: Secondary | ICD-10-CM | POA: Diagnosis not present

## 2016-07-23 ENCOUNTER — Ambulatory Visit (INDEPENDENT_AMBULATORY_CARE_PROVIDER_SITE_OTHER): Payer: 59 | Admitting: Neurology

## 2016-07-23 ENCOUNTER — Encounter: Payer: Self-pay | Admitting: Neurology

## 2016-07-23 VITALS — Ht 73.0 in | Wt 232.4 lb

## 2016-07-23 DIAGNOSIS — F489 Nonpsychotic mental disorder, unspecified: Secondary | ICD-10-CM | POA: Diagnosis not present

## 2016-07-23 DIAGNOSIS — R4789 Other speech disturbances: Secondary | ICD-10-CM | POA: Diagnosis not present

## 2016-07-23 DIAGNOSIS — R51 Headache with orthostatic component, not elsewhere classified: Secondary | ICD-10-CM

## 2016-07-23 DIAGNOSIS — R410 Disorientation, unspecified: Secondary | ICD-10-CM | POA: Diagnosis not present

## 2016-07-23 DIAGNOSIS — H539 Unspecified visual disturbance: Secondary | ICD-10-CM
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
## 2016-07-23 NOTE — Patient Instructions (Addendum)
Remember to drink plenty of fluid, eat healthy meals and do not skip any meals. Try to eat protein with a every meal and eat a healthy snack such as fruit or nuts in between meals. Try to keep a regular sleep-wake schedule and try to exercise daily, particularly in the form of walking, 20-30 minutes a day, if you can.   As far as diagnostic testing: MRI brain, labs  I would like to see you back in 4-6 months, sooner if we need to. Please call us with any interim questions, concerns, problems, updates or refill requests.   Our phone number is [phone]. We also have an after hours call service for urgent matters and there is a physician on-call for urgent questions. For any emergencies you know to call 911 or go to the nearest emergency room  Recommend psychiatry in therapy.

## 2016-07-27 LAB — CREATININE, SERUM
CREATININE: 0.85 mg/dL (ref 0.57–1.00)
GFR, EST AFRICAN AMERICAN: 103 mL/min/{1.73_m2} (ref >59)
GFR, EST NON AFRICAN AMERICAN: 89 mL/min/{1.73_m2} (ref >59)

## 2016-07-27 LAB — BUN: BUN: 11 mg/dL (ref 6–20)

## 2016-07-27 LAB — AMMONIA: Ammonia: 27 ug/dL (ref 19–87)

## 2016-07-27 LAB — RPR: RPR Ser Ql: NONREACTIVE

## 2016-07-27 LAB — HIV ANTIBODY (ROUTINE TESTING W REFLEX): HIV Screen 4th Generation wRfx: NONREACTIVE

## 2016-07-27 LAB — VITAMIN B1: Thiamine: 171.4 nmol/L (ref 66.5–200.0)

## 2016-08-12 ENCOUNTER — Ambulatory Visit
Admission: RE | Admit: 2016-08-12 | Discharge: 2016-08-12 | Disposition: A | Discharge status: Home / Self Care | Payer: 59 | Source: Ambulatory Visit | Attending: Neurology | Admitting: Neurology

## 2016-08-12 DIAGNOSIS — R4789 Other speech disturbances: Secondary | ICD-10-CM

## 2016-08-12 DIAGNOSIS — R51 Headache with orthostatic component, not elsewhere classified: Secondary | ICD-10-CM

## 2016-08-12 DIAGNOSIS — R410 Disorientation, unspecified: Secondary | ICD-10-CM | POA: Diagnosis not present

## 2016-08-12 DIAGNOSIS — F489 Nonpsychotic mental disorder, unspecified: Secondary | ICD-10-CM

## 2016-08-12 DIAGNOSIS — H539 Unspecified visual disturbance: Secondary | ICD-10-CM

## 2016-08-12 MED ORDER — GADOBENATE DIMEGLUMINE 529 MG/ML IV SOLN
20.0000 mL | Freq: Once | INTRAVENOUS | Status: AC | PRN
  Administered 2016-08-12: 20 mL via INTRAVENOUS

## 2016-08-20 ENCOUNTER — Telehealth: Payer: Self-pay | Admitting: *Deleted

## 2016-08-20 NOTE — Telephone Encounter (Signed)
-----   Message from Anson FretAntonia B Ahern, MD sent at 08/19/2016 11:45 AM EDT ----- The MRI brain is unremarkable. As we age we sometimes see something called white matter changes, which can be seen in normal aging and are seen in her MRI which are mild. However these changes can be due to smoking and vascular other risk factors. . But I would recommend she discuss alcohol with her pcp, and also watch her cholesterol and blood pressure and other vascular risk factors, stop smoking, healthy weight and diet thanks. Nothing on her MRI to explain her symptoms.

## 2016-08-20 NOTE — Telephone Encounter (Signed)
LMVM for pt to return call for MRI results.  

## 2016-08-24 ENCOUNTER — Telehealth: Payer: Self-pay | Admitting: *Deleted

## 2016-08-24 NOTE — Telephone Encounter (Signed)
-----   Message from Anson FretAntonia B Ahern, MD sent at 08/19/2016 11:45 AM EDT ----- The MRI brain is unremarkable. As we age we sometimes see something called white matter changes, which can be seen in normal aging and are seen in her MRI which are mild. However these changes can be due to smoking and vascular other risk factors. . But I would recommend she discuss alcohol with her pcp, and also watch her cholesterol and blood pressure and other vascular risk factors, stop smoking, healthy weight and diet thanks. Nothing on her MRI to explain her symptoms.

## 2016-08-24 NOTE — Telephone Encounter (Signed)
See other phone note

## 2016-08-24 NOTE — Telephone Encounter (Signed)
Spoke to pt and let her know results of MRI as per Dr. Trevor MaceAhern's result note.  She wanted copy of report.  Will mail.  Pt verbalized understanding.

## 2016-08-24 NOTE — Telephone Encounter (Signed)
Patient called office returning RN's call.  Please call °

## 2016-11-22 ENCOUNTER — Ambulatory Visit: Payer: 59 | Admitting: Nurse Practitioner
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
## 2016-11-23 ENCOUNTER — Encounter: Payer: Self-pay | Admitting: Nurse Practitioner

## 2016-12-07 DIAGNOSIS — Z23 Encounter for immunization: Secondary | ICD-10-CM | POA: Diagnosis not present

## 2017-03-21 DIAGNOSIS — R52 Pain, unspecified: Secondary | ICD-10-CM | POA: Diagnosis not present

## 2017-09-21 DIAGNOSIS — J019 Acute sinusitis, unspecified: Secondary | ICD-10-CM | POA: Diagnosis not present

## 2017-09-21 DIAGNOSIS — J029 Acute pharyngitis, unspecified: Secondary | ICD-10-CM | POA: Diagnosis not present

## 2017-11-08 DIAGNOSIS — F5101 Primary insomnia: Secondary | ICD-10-CM | POA: Diagnosis not present

## 2017-12-26 DIAGNOSIS — H18213 Corneal edema secondary to contact lens, bilateral: Secondary | ICD-10-CM | POA: Diagnosis not present

## 2018-05-30 DIAGNOSIS — R1011 Right upper quadrant pain: Secondary | ICD-10-CM | POA: Diagnosis not present

## 2018-06-01 ENCOUNTER — Other Ambulatory Visit: Payer: Self-pay | Admitting: Family Medicine

## 2018-06-01 DIAGNOSIS — R1011 Right upper quadrant pain: Secondary | ICD-10-CM

## 2018-06-06 ENCOUNTER — Ambulatory Visit
Admission: RE | Admit: 2018-06-06 | Discharge: 2018-06-06 | Disposition: A | Discharge status: Home / Self Care | Payer: 59 | Source: Ambulatory Visit | Attending: Family Medicine | Admitting: Family Medicine

## 2018-06-06 ENCOUNTER — Other Ambulatory Visit: Payer: Self-pay

## 2018-06-06 DIAGNOSIS — R1011 Right upper quadrant pain: Secondary | ICD-10-CM

## 2018-08-15 NOTE — Progress Notes (Signed)
LM for pt to return call to be prescreened for 7-1 visit.  Loaded meds from Referral paperwork. Recurrent RUQ pain. U/S 05-2018.

## 2018-08-15 NOTE — Progress Notes (Signed)
Prescreened for tomorrows visit 

## 2018-08-16 ENCOUNTER — Ambulatory Visit (INDEPENDENT_AMBULATORY_CARE_PROVIDER_SITE_OTHER): Payer: 59 | Admitting: Gastroenterology

## 2018-08-16 ENCOUNTER — Encounter: Payer: Self-pay | Admitting: Gastroenterology

## 2018-08-16 ENCOUNTER — Other Ambulatory Visit (INDEPENDENT_AMBULATORY_CARE_PROVIDER_SITE_OTHER): Payer: 59

## 2018-08-16 ENCOUNTER — Other Ambulatory Visit: Payer: Self-pay | Admitting: Gastroenterology

## 2018-08-16 VITALS — Ht 73.0 in | Wt 235.0 lb

## 2018-08-16 DIAGNOSIS — R110 Nausea: Secondary | ICD-10-CM | POA: Diagnosis not present

## 2018-08-16 DIAGNOSIS — K219 Gastro-esophageal reflux disease without esophagitis: Secondary | ICD-10-CM

## 2018-08-16 DIAGNOSIS — R1011 Right upper quadrant pain: Secondary | ICD-10-CM

## 2018-08-16 LAB — COMPREHENSIVE METABOLIC PANEL
ALT: 21 U/L (ref 0–35)
AST: 17 U/L (ref 0–37)
Albumin: 4.5 g/dL (ref 3.5–5.2)
Alkaline Phosphatase: 73 U/L (ref 39–117)
BUN: 11 mg/dL (ref 6–23)
CO2: 24 mEq/L (ref 19–32)
Calcium: 9.1 mg/dL (ref 8.4–10.5)
Chloride: 108 mEq/L (ref 96–112)
Creatinine, Ser: 0.92 mg/dL (ref 0.40–1.20)
GFR: 68.39 mL/min (ref >60.00)
Glucose, Bld: 96 mg/dL (ref 70–99)
Potassium: 3.8 mEq/L (ref 3.5–5.1)
Sodium: 140 mEq/L (ref 135–145)
Total Bilirubin: 0.3 mg/dL (ref 0.2–1.2)
Total Protein: 7.5 g/dL (ref 6.0–8.3)

## 2018-08-16 LAB — CBC WITH DIFFERENTIAL/PLATELET
Basophils Absolute: 0.1 10*3/uL (ref 0.0–0.1)
Basophils Relative: 0.9 % (ref 0.0–3.0)
Eosinophils Absolute: 0.1 10*3/uL (ref 0.0–0.7)
Eosinophils Relative: 0.8 % (ref 0.0–5.0)
HCT: 42.3 % (ref 36.0–46.0)
Hemoglobin: 14.4 g/dL (ref 12.0–15.0)
Lymphocytes Relative: 29.2 % (ref 12.0–46.0)
Lymphs Abs: 2.7 10*3/uL (ref 0.7–4.0)
MCHC: 34.1 g/dL (ref 30.0–36.0)
MCV: 94.7 fl (ref 78.0–100.0)
Monocytes Absolute: 0.6 10*3/uL (ref 0.1–1.0)
Monocytes Relative: 6.6 % (ref 3.0–12.0)
Neutro Abs: 5.9 10*3/uL (ref 1.4–7.7)
Neutrophils Relative %: 62.5 % (ref 43.0–77.0)
Platelets: 296 10*3/uL (ref 150.0–400.0)
RBC: 4.46 Mil/uL (ref 3.87–5.11)
RDW: 13.8 % (ref 11.5–15.5)
WBC: 9.4 10*3/uL (ref 4.0–10.5)

## 2018-08-16 LAB — LIPASE: Lipase: 41 U/L (ref 11.0–59.0)

## 2018-08-16 MED ORDER — ESOMEPRAZOLE MAGNESIUM 40 MG PO CPDR: 40.0000 mg | DELAYED_RELEASE_CAPSULE | Freq: Two times a day (BID) | ORAL | 3 refills | Status: DC

## 2018-08-16 NOTE — Patient Instructions (Addendum)
If you are age 38 or older, your body mass index should be between 23-30. Your Body mass index is 31 kg/m. If this is out of the aforementioned range listed, please consider follow up with your Primary Care Provider.  If you are age 22 or younger, your body mass index should be between 19-25. Your Body mass index is 31 kg/m. If this is out of the aformentioned range listed, please consider follow up with your Primary Care Provider.   To help prevent the possible spread of infection to our patients, communities, and staff; we will be implementing the following measures:  As of now we are not allowing any visitors/family members to accompany you to any upcoming appointments with [facility]. If you have any concerns about this please contact our office to discuss prior to the appointment.   You have been scheduled for an endoscopy for tomorrow, 7-2 at 1:30pm. Please follow written instructions given to you at your visit today. If you use inhalers (even only as needed), please bring them with you on the day of your procedure. Your physician has requested that you go to www.startemmi.com and enter the access code given to you at your visit today. This web site gives a general overview about your procedure. However, you should still follow specific instructions given to you by our office regarding your preparation for the procedure.  Please go to the lab in the basement of our building to have lab work done. Hit "B" for basement when you get on the elevator.  When the doors open the lab is on your left.  We will call you with the results. Thank you.  Increase Nexium to twice a day.  If you decide you need something for nausea we could prescribe Zofran 4mg  ODT tablets (take every 6 hours as needed). Just let Jan know if you would like a prescription.   Thank you for entrusting me with your care and for choosing [facility], Dr. Donna Cellar
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
## 2018-08-17 ENCOUNTER — Ambulatory Visit (AMBULATORY_SURGERY_CENTER): Payer: 59 | Admitting: Gastroenterology

## 2018-08-17 ENCOUNTER — Other Ambulatory Visit: Payer: Self-pay

## 2018-08-17 ENCOUNTER — Encounter: Payer: Self-pay | Admitting: Gastroenterology

## 2018-08-17 VITALS — BP 112/68 | HR 66 | Temp 98.6°F | Resp 12 | Ht 73.0 in | Wt 235.0 lb

## 2018-08-17 DIAGNOSIS — K2950 Unspecified chronic gastritis without bleeding: Secondary | ICD-10-CM

## 2018-08-17 DIAGNOSIS — R1011 Right upper quadrant pain: Secondary | ICD-10-CM

## 2018-08-17 DIAGNOSIS — K2970 Gastritis, unspecified, without bleeding: Secondary | ICD-10-CM

## 2018-08-17 MED ORDER — SODIUM CHLORIDE 0.9 % IV SOLN: 500.0000 mL | Freq: Once | INTRAVENOUS | Status: DC

## 2018-08-17 NOTE — Op Note (Signed)
[facility] Patient Name: Gabriela Alvarado Procedure Date: 08/17/2018 1:35 PM MRN: [id number] Endoscopist: Viviann SpareSteven P. Adela LankArmbruster , MD Age: [age] Referring MD:  Date of Birth: [birth year] Gender: Female Account #: [id number] Procedure:                Upper GI endoscopy Indications:              Abdominal pain in the right upper quadrant, Nausea                            - RUQ US negative for gallstones, labs normal Medicines:                Monitored Anesthesia Care Procedure:                Pre-Anesthesia Assessment:                           - Prior to the procedure, a History and Physical                            was performed, and patient medications and                            allergies were reviewed. The patient's tolerance of                            previous anesthesia was also reviewed. The risks                            and benefits of the procedure and the sedation                            options and risks were discussed with the patient.                            All questions were answered, and informed consent                            was obtained. Prior Anticoagulants: The patient has                            taken no previous anticoagulant or antiplatelet                            agents. ASA Grade Assessment: II - A patient with                            mild systemic disease. After reviewing the risks                            and benefits, the patient was deemed in                            satisfactory condition to undergo the procedure.  After obtaining informed consent, the endoscope was                            passed under direct vision. Throughout the                            procedure, the patient's blood pressure, pulse, and                            oxygen saturations were monitored continuously. The                            Endoscope was introduced through the mouth, and   advanced to the second part of duodenum. The upper                            GI endoscopy was accomplished without difficulty.                            The patient tolerated the procedure well. Scope In: Scope Out: Findings:                 Esophagogastric landmarks were identified: the                            Z-line was found at 38 cm, the gastroesophageal                            junction was found at 38 cm and the upper extent of                            the gastric folds was found at 38 cm from the                            incisors.                           The exam of the esophagus was otherwise normal.                           Patchy inflammation characterized by adherent                            blood, erythema and friability was found on the                            greater curvature of the gastric body, without                            focal ulceration                           The exam of the stomach was otherwise normal.  Biopsies were taken with a cold forceps in the                            gastric body, at the incisura and in the gastric                            antrum for Helicobacter pylori testing.                           The duodenal bulb and second portion of the                            duodenum were normal. Complications:            No immediate complications. Estimated blood loss:                            Minimal. Estimated Blood Loss:     Estimated blood loss was minimal. Impression:               - Esophagogastric landmarks identified.                           - Normal esophagus                           - Focal gastritis. Biopsies taken to rule out H                            pylori                           - Normal duodenal bulb and second portion of the                            duodenum. Recommendation:           - Patient has a contact number available for                            emergencies. The  signs and symptoms of potential                            delayed complications were discussed with the                            patient. Return to normal activities tomorrow.                            Written discharge instructions were provided to the                            patient.                           - Resume previous diet.                           -  Continue present medications.                           - Continue with trial of nexium twice daily                           - Await pathology results.                           - If biopsies negative and no improvement with                            twice daily nexium, will recommend HIDA scan to                            further evaluate the gallbladder Viviann SpareSteven P. Armbruster, MD 08/17/2018 1:53:18 PM This report has been signed electronically.

## 2018-08-17 NOTE — Progress Notes (Signed)
PT taken to PACU. Monitors in place. VSS. Report given to RN. 

## 2018-08-17 NOTE — Patient Instructions (Signed)
Continue with Nexium twice daily.   YOU HAD AN ENDOSCOPIC PROCEDURE TODAY AT [facility]:   Refer to the procedure report that was given to you for any specific questions about what was found during the examination.  If the procedure report does not answer your questions, please call your gastroenterologist to clarify.  If you requested that your care partner not be given the details of your procedure findings, then the procedure report has been included in a sealed envelope for you to review at your convenience later.  YOU SHOULD EXPECT: Some feelings of bloating in the abdomen. Passage of more gas than usual.  Walking can help get rid of the air that was put into your GI tract during the procedure and reduce the bloating. If you had a lower endoscopy (such as a colonoscopy or flexible sigmoidoscopy) you may notice spotting of blood in your stool or on the toilet paper. If you underwent a bowel prep for your procedure, you may not have a normal bowel movement for a few days.  Please Note:  You might notice some irritation and congestion in your nose or some drainage.  This is from the oxygen used during your procedure.  There is no need for concern and it should clear up in a day or so.  SYMPTOMS TO REPORT IMMEDIATELY:    Following upper endoscopy (EGD)  Vomiting of blood or coffee ground material  New chest pain or pain under the shoulder blades  Painful or persistently difficult swallowing  New shortness of breath  Fever of 100F or higher  Black, tarry-looking stools  For urgent or emergent issues, a gastroenterologist can be reached at any hour by calling [phone].   DIET:  We do recommend a small meal at first, but then you may proceed to your regular diet.  Drink plenty of fluids but you should avoid alcoholic beverages for 24 hours.  ACTIVITY:  You should plan to take it easy for the rest of today and you should NOT DRIVE or use heavy machinery until tomorrow  (because of the sedation medicines used during the test).    FOLLOW UP: Our staff will call the number listed on your records 48-72 hours following your procedure to check on you and address any questions or concerns that you may have regarding the information given to you following your procedure. If we do not reach you, we will leave a message.  We will attempt to reach you two times.  During this call, we will ask if you have developed any symptoms of COVID 19. If you develop any symptoms (ie: fever, flu-like symptoms, shortness of breath, cough etc.) before then, please call [phone].  If you test positive for Covid 19 in the 2 weeks post procedure, please call and report this information to [place].    If any biopsies were taken you will be contacted by phone or by letter within the next 1-3 weeks.  Please call us at [phone] if you have not heard about the biopsies in 3 weeks.    SIGNATURES/CONFIDENTIALITY: You and/or your care partner have signed paperwork which will be entered into your electronic medical record.  These signatures attest to the fact that that the information above on your After Visit Summary has been reviewed and is understood.  Full responsibility of the confidentiality of this discharge information lies with you and/or your care-partner.

## 2018-08-17 NOTE — Progress Notes (Signed)
Called to room to assist during endoscopic procedure.  Patient ID and intended procedure confirmed with present staff. Received instructions for my participation in the procedure from the performing physician.  

## 2018-08-22 ENCOUNTER — Telehealth: Payer: Self-pay | Admitting: *Deleted

## 2018-08-22 NOTE — Telephone Encounter (Signed)
  Follow up Call-  Call back number 08/17/2018  Post procedure Call Back phone  # [phone]  Permission to leave phone message Yes  Some recent data might be hidden     Patient questions:  Do you have a fever, pain , or abdominal swelling? No. Pain Score  0 *  Have you tolerated food without any problems? Yes.    Have you been able to return to your normal activities? Yes.    Do you have any questions about your discharge instructions: Diet   No. Medications  No. Follow up visit  No.  Do you have questions or concerns about your Care? No.  Actions: * If pain score is 4 or above: 1. No action needed, pain <4.Have you developed a fever since your procedure? No  2.   Have you had an respiratory symptoms (SOB or cough) since your procedure? no  3.   Have you tested positive for COVID 19 since your procedure no  4.   Have you had any family members/close contacts diagnosed with the COVID 19 since your procedure?  no   2. If yes to any of these questions please route to Joylene John, RN and Alphonsa Gin, RN. Have you developed a fever since your procedure?

## 2018-10-19 ENCOUNTER — Other Ambulatory Visit: Payer: Self-pay

## 2018-10-19 ENCOUNTER — Telehealth: Payer: Self-pay

## 2018-10-19 ENCOUNTER — Telehealth: Payer: Self-pay | Admitting: Gastroenterology

## 2018-10-19 DIAGNOSIS — R1011 Right upper quadrant pain: Secondary | ICD-10-CM

## 2018-10-19 NOTE — Telephone Encounter (Signed)
Attempted to call patient back, rings and then changes to busy sound. Only 1 phone. Will try later

## 2018-10-19 NOTE — Telephone Encounter (Signed)
Patient called and states she has had diarrhea for 3 days. It starts semi-formed in the morning and becomes total liquid as the day goes on.yellow/brown in color. No blood/no mucus. 4-6 episodes a day. No emesis, but some dry heaves. Also RUQ pain that is constantly "2-3" and intermittently "7" on 1-10. She is eating very little. I encouraged her to drink plenty of clear liquids. She took her Nexium BID for 2 weeks after her EGD on 08/17/18 and then went back to once a day. States she is not having heart burn. Please advise

## 2018-10-19 NOTE — Telephone Encounter (Signed)
Called patient and asked her to take immodium for the loose stools. Scheduled for HIDA scan at [facility] on 10/31/18  (first available) patient to arrive at 10:45am. Patient advised to be NPO after midnight. Not to take any pain meds that day. Advised patient the test would take 2 hours.

## 2018-10-19 NOTE — Telephone Encounter (Signed)
Gabriela Alvarado if her RUQ pain continues to bother her I think we should send her for a HIDA scan to evaluate her gallbladder (negative EGD and RUQ [place] thus far). Can you help schedule. Otherwise, regarding loose stools, seems acute for only a few days, she can try some immodium to see if it helps. If not and symptoms persist moving into next week she should contact us for reassessment. Thanks

## 2018-10-31 ENCOUNTER — Other Ambulatory Visit: Payer: Self-pay

## 2018-10-31 ENCOUNTER — Encounter (HOSPITAL_COMMUNITY)
Admission: RE | Admit: 2018-10-31 | Discharge: 2018-10-31 | Disposition: A | Discharge status: Home / Self Care | Payer: 59 | Source: Ambulatory Visit | Attending: Gastroenterology | Admitting: Gastroenterology

## 2018-10-31 DIAGNOSIS — R1011 Right upper quadrant pain: Secondary | ICD-10-CM | POA: Diagnosis not present

## 2018-10-31 MED ORDER — TECHNETIUM TC 99M MEBROFENIN IV KIT
5.1000 | PACK | Freq: Once | INTRAVENOUS | Status: AC | PRN
  Administered 2018-10-31: 5.1 via INTRAVENOUS

## 2018-11-02 ENCOUNTER — Other Ambulatory Visit: Payer: Self-pay | Admitting: Gastroenterology

## 2018-11-15 ENCOUNTER — Ambulatory Visit: Payer: Self-pay | Admitting: Surgery

## 2018-11-15 NOTE — H&P (Signed)
History of Present Illness Gabriela Alvarado. Gabriela Klumpp MD; 11/15/2018 2:53 PM) The patient is a 38 year old female who presents with abdominal pain. Referred by Dr. Teressa Senter GI  This is a 38 year old female in otherwise good health who presents with a month history of intermittent post prandial pain. This is located in her right upper quadrant under her costal margin and radiates through to her back. Over the last few months the pain has become worse. Is now constant and associated with nausea as well as diarrhea. He gets worse with eating. She has been afraid to eat. She has had a workup including ultrasound which was unremarkable. EGD was also unremarkable. HIDA scan showed a gallbladder ejection fraction at 35% with cutoff being 33%. However her symptoms recurred with ingestion of Ensure during the HIDA scan. She presents now to discuss cholecystectomy.  CLINICAL DATA: Right upper quadrant abdominal pain.  EXAM: ULTRASOUND ABDOMEN LIMITED RIGHT UPPER QUADRANT  COMPARISON: None.  FINDINGS: Gallbladder:  No gallstones or wall thickening visualized. No sonographic Murphy sign noted by sonographer.  Common bile duct:  Diameter: 3 mm which is within normal limits.  Liver:  No focal lesion identified. Within normal limits in parenchymal echogenicity. Portal vein is patent on color Doppler imaging with normal direction of blood flow towards the liver.  IMPRESSION: No abnormality seen in the right upper quadrant of the abdomen.   Electronically Signed By: Lupita Raider M.D. On: 06/06/2018 09:36  CLINICAL DATA: Right upper quadrant pain  EXAM: NUCLEAR MEDICINE HEPATOBILIARY IMAGING WITH GALLBLADDER EF  TECHNIQUE: Sequential images of the abdomen were obtained out to 60 minutes following intravenous administration of radiopharmaceutical. After oral ingestion of Ensure, gallbladder ejection fraction was determined. At 60 min, normal ejection fraction is  greater than 33%.  RADIOPHARMACEUTICALS: 5.1 mCi Tc-62m Choletec IV  COMPARISON: Ultrasound 10/06/2018  FINDINGS: Prompt uptake and biliary excretion of activity by the liver is seen. Gallbladder activity is visualized, consistent with patency of cystic duct. Biliary activity passes into small bowel, consistent with patent common bile duct.  Calculated gallbladder ejection fraction is 35%. (Normal gallbladder ejection fraction with Ensure is greater than 33%.)  IMPRESSION: Negative examination   Electronically Signed By: Jasmine Pang M.D. On: 10/31/2018 14:31   Problem List/Past Medical Molli Hazard K. Adyn Hoes, MD; 11/15/2018 2:53 PM) CHRONIC CHOLECYSTITIS WITHOUT CALCULUS (K81.1)   Diagnostic Studies History (Gabriela Alvarado, RMA; 11/15/2018 2:21 PM) Colonoscopy  never Mammogram  never Pap Smear  1-5 years ago  Allergies (Gabriela Alvarado, RMA; 11/15/2018 2:23 PM) Antihistamine *ANTIHISTAMINES*  Red Dyes  ASPARAGUS  Allergies Reconciled   Medication History (Gabriela Alvarado, RMA; 11/15/2018 2:23 PM) buPROPion HCl ER (XL) (150MG  Tablet ER 24HR, Oral) Active. Sertraline HCl (100MG  Tablet, Oral) Active. Multi-Vitamin (Oral) Active. Medications Reconciled  Social History (Gabriela A. , RMA; 11/15/2018 2:21 PM) Alcohol use  Occasional alcohol use. Caffeine use  Carbonated beverages, Coffee. No drug use  Tobacco use  Current every day smoker.  Family History (Gabriela Alvarado, RMA; 11/15/2018 2:21 PM) First Degree Relatives  No pertinent family history   Pregnancy / Birth History (Gabriela Alvarado, RMA; 11/15/2018 2:21 PM) Age at menarche  13 years. Contraceptive History  Intrauterine device. Gravida  1 Maternal age  39-20 Para  1 Regular periods   Other Problems 11/17/2018. Gabriela Peckman, MD; 11/15/2018 2:53 PM) Anxiety Disorder  Depression  Migraine Headache     Review of Systems (Gabriela A. Brown RMA; 11/15/2018 2:21 PM) General Not  Present-  Appetite Loss, Chills, Fatigue, Fever, Night Sweats, Weight Gain and Weight Loss. Skin Not Present- Change in Wart/Mole, Dryness, Hives, Jaundice, New Lesions, Non-Healing Wounds, Rash and Ulcer. HEENT Present- Wears glasses/contact lenses. Not Present- Earache, Hearing Loss, Hoarseness, Nose Bleed, Oral Ulcers, Ringing in the Ears, Seasonal Allergies, Sinus Pain, Sore Throat, Visual Disturbances and Yellow Eyes. Respiratory Not Present- Bloody sputum, Chronic Cough, Difficulty Breathing, Snoring and Wheezing. Breast Not Present- Breast Mass, Breast Pain, Nipple Discharge and Skin Changes. Cardiovascular Not Present- Chest Pain, Difficulty Breathing Lying Down, Leg Cramps, Palpitations, Rapid Heart Rate, Shortness of Breath and Swelling of Extremities. Gastrointestinal Present- Abdominal Pain, Change in Bowel Habits and Nausea. Not Present- Bloating, Bloody Stool, Chronic diarrhea, Constipation, Difficulty Swallowing, Excessive gas, Gets full quickly at meals, Hemorrhoids, Indigestion, Rectal Pain and Vomiting. Female Genitourinary Not Present- Frequency, Nocturia, Painful Urination, Pelvic Pain and Urgency. Musculoskeletal Not Present- Back Pain, Joint Pain, Joint Stiffness, Muscle Pain, Muscle Weakness and Swelling of Extremities. Psychiatric Present- Anxiety and Depression. Not Present- Bipolar, Change in Sleep Pattern, Fearful and Frequent crying. Endocrine Not Present- Cold Intolerance, Excessive Hunger, Hair Changes, Heat Intolerance, Hot flashes and New Diabetes. Hematology Not Present- Blood Thinners, Easy Bruising, Excessive bleeding, Gland problems, HIV and Persistent Infections.  Vitals (Gabriela A. Brown RMA; 11/15/2018 2:22 PM) 11/15/2018 2:22 PM Weight: 237.4 lb Height: 73in Body Surface Area: 2.31 m Body Mass Index: 31.32 kg/m  Temp.: 97.82F  Pulse: 102 (Regular)  BP: 128/82(Sitting, Left Arm, Standard)       Physical Exam Rodman Key K. Aleem Elza MD; 11/15/2018  2:54 PM) The physical exam findings are as follows: Note: WDWN in NAD Eyes: Pupils equal, round; sclera anicteric HENT: Oral mucosa moist; good dentition Neck: No masses palpated, no thyromegaly Lungs: CTA bilaterally; normal respiratory effort CV: Regular rate and rhythm; no murmurs; extremities well-perfused with no edema Abd: +bowel sounds, soft, mildly tender in RUQ, no palpable organomegaly; no palpable hernias Skin: Warm, dry; no sign of jaundice Psychiatric - alert and oriented x 4; calm mood and affect    Assessment & Plan Rodman Key K. Mendi Constable MD; 11/15/2018 2:55 PM)  CHRONIC CHOLECYSTITIS WITHOUT CALCULUS (K81.1)  Current Plans Schedule for Surgery - Laparoscopic cholecystectomy with intraoperative cholangiogram. The surgical procedure has been discussed with the patient. Potential risks, benefits, alternative treatments, and expected outcomes have been explained. All of the patient's questions at this time have been answered. The likelihood of reaching the patient's treatment goal is good. The patient understand the proposed surgical procedure and wishes to proceed. Note:The patient understands that none of her test clearly showed gallstones or a clear etiology for her abdominal pain. However her symptoms are classic for gallbladder disease. Her ejection fraction is borderline and she did have recurrence of her symptoms during the HIDA scan. Therefore, we will proceed with laparoscopic cholecystectomy with intraoperative cholangiogram as discussed. She is enthusiastic and ready to pursue this course of treatment.  Imogene Burn. Georgette Dover, MD, [facility] [phone]  11/15/2018 4:33 PM

## 2018-12-14 ENCOUNTER — Other Ambulatory Visit: Payer: Self-pay | Admitting: Surgery

## 2019-03-11 ENCOUNTER — Other Ambulatory Visit: Payer: Self-pay | Admitting: Gastroenterology

## 2019-06-14 IMAGING — US ULTRASOUND ABDOMEN LIMITED
1 series · 14 of 25 positions shown · non-contrast
Comparison: None.

CLINICAL DATA: Right upper quadrant abdominal pain.

EXAM:
ULTRASOUND ABDOMEN LIMITED RIGHT UPPER QUADRANT

[Series 1: ultrasound abdomen limited · 0.20mm/px · 14 of 44 slices shown]
[im 1/44]
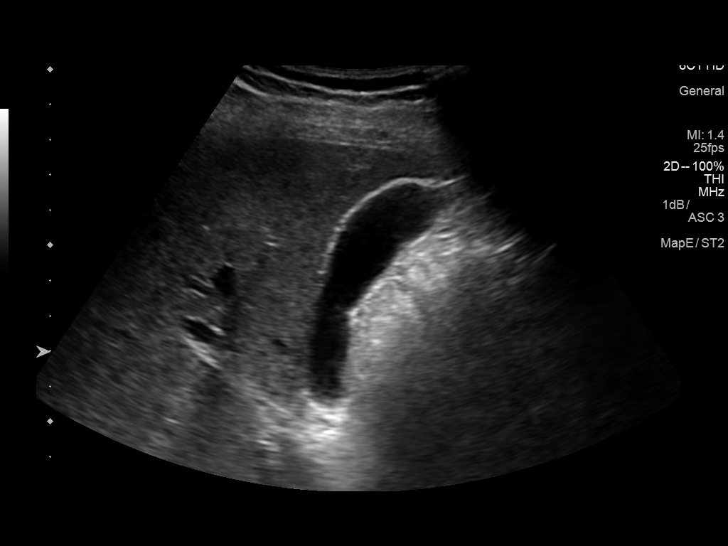
[im 4/44]
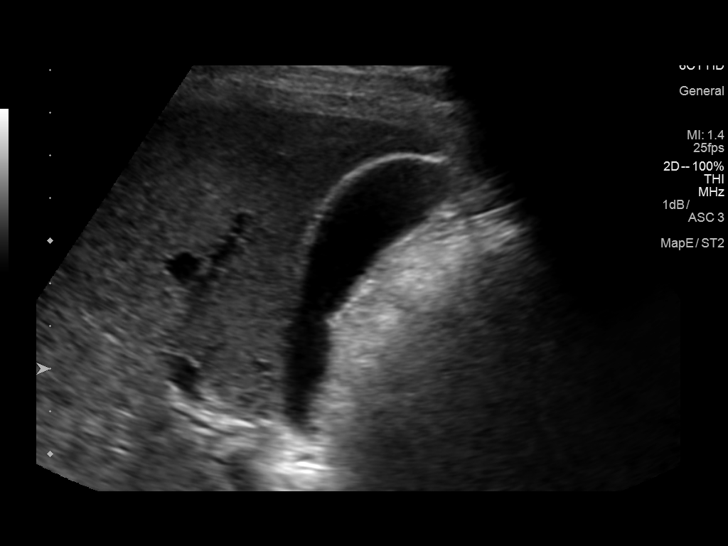
[im 8/44]
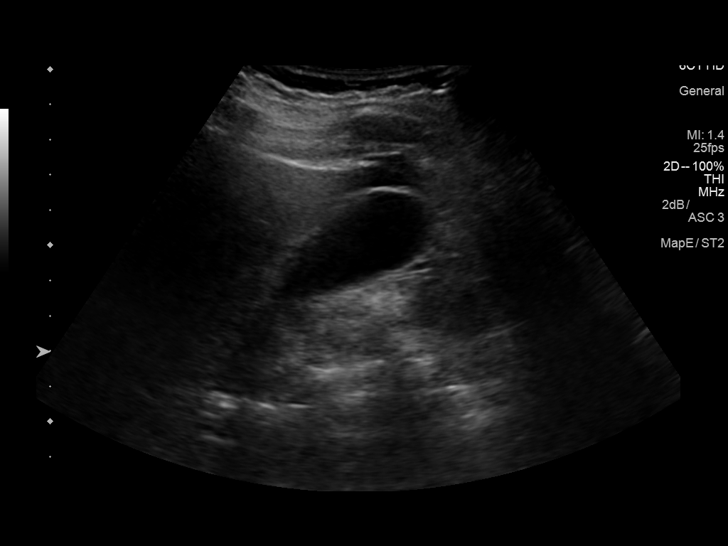
[im 11/44]
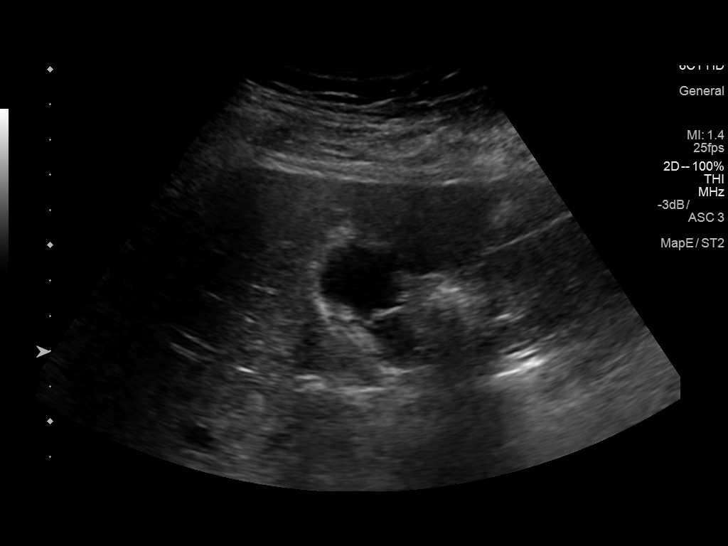
[im 15/44]
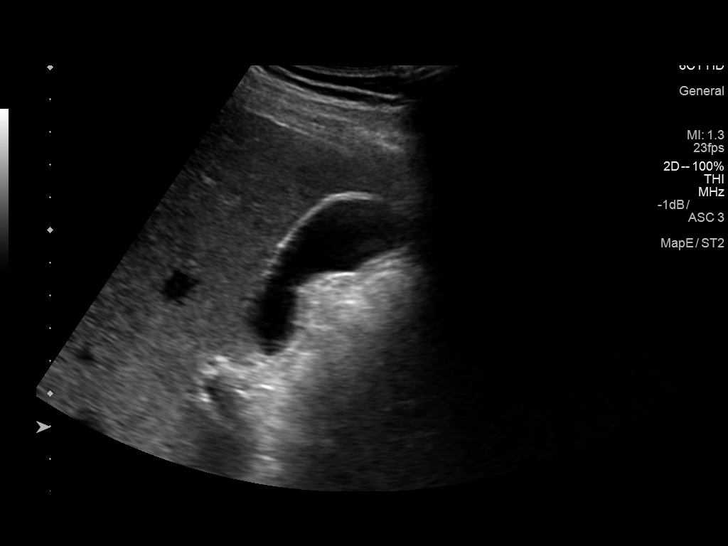
[im 17/44]
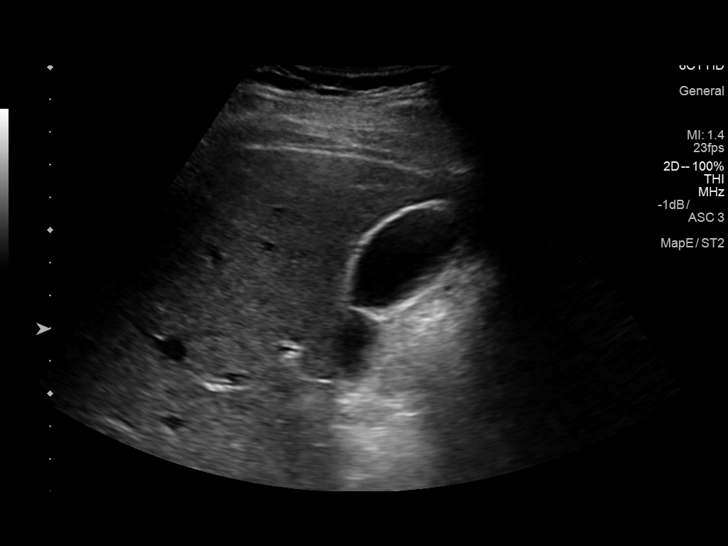
[im 20/44]
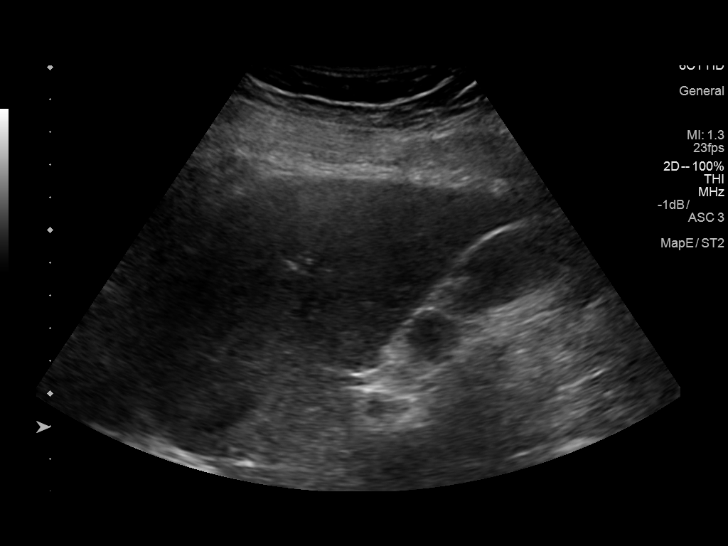
[im 24/44]
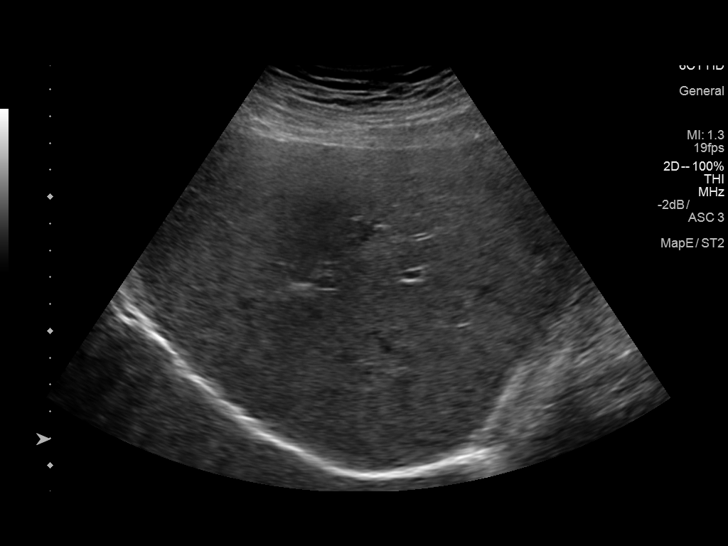
[im 27/44]
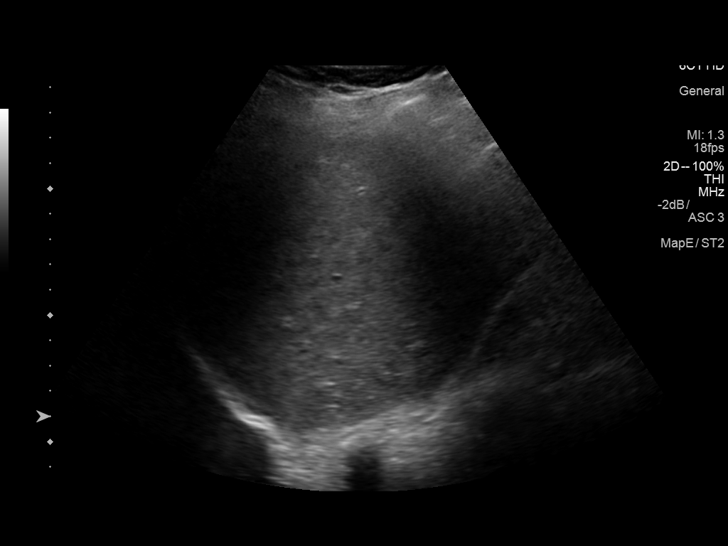
[im 29/44]
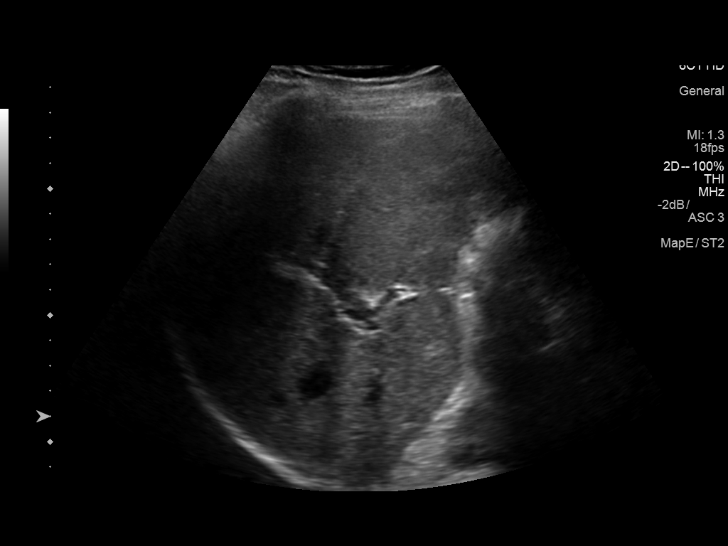
[im 33/44]
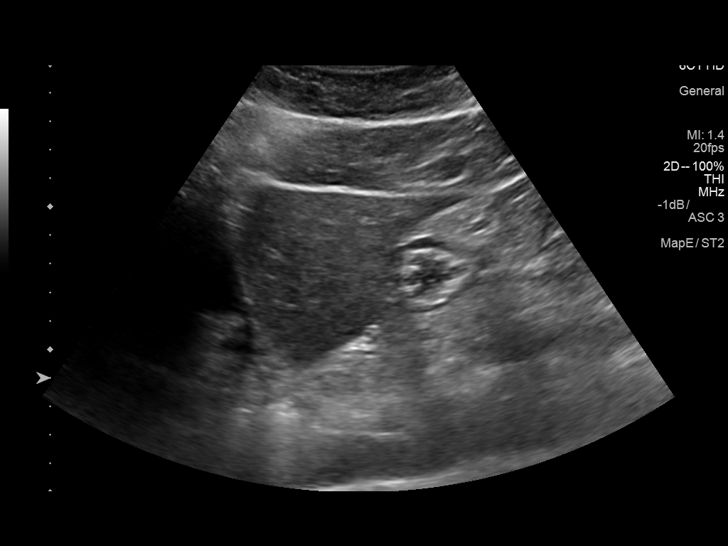
[im 36/44]
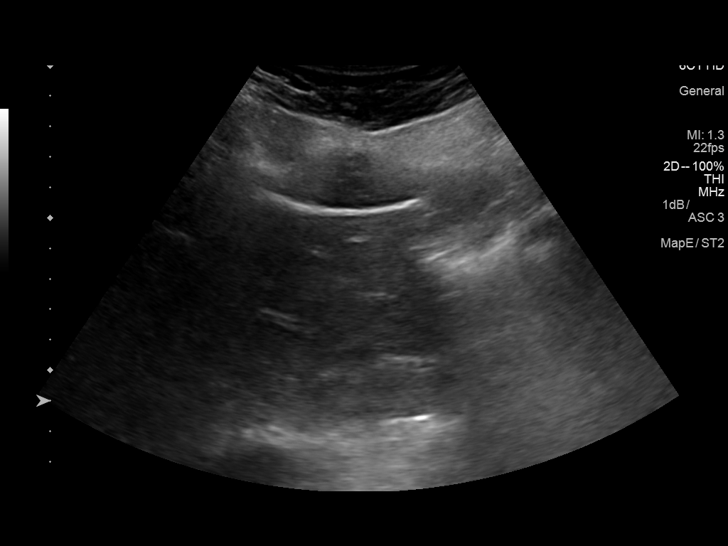
[im 40/44]
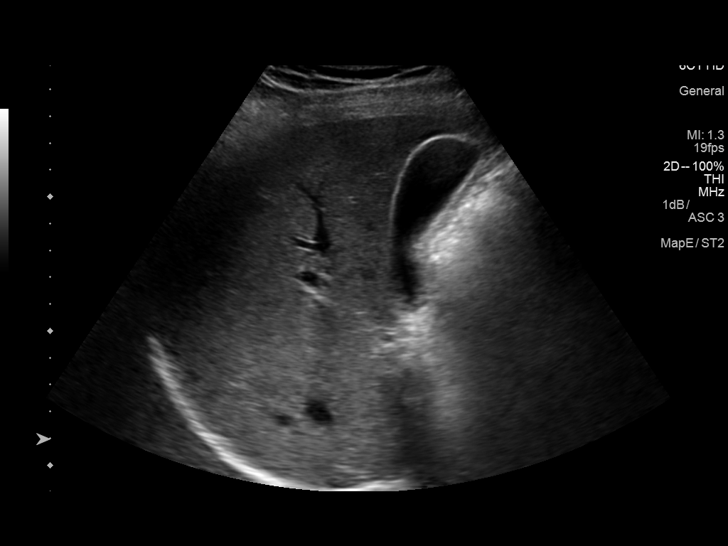
[im 44/44]
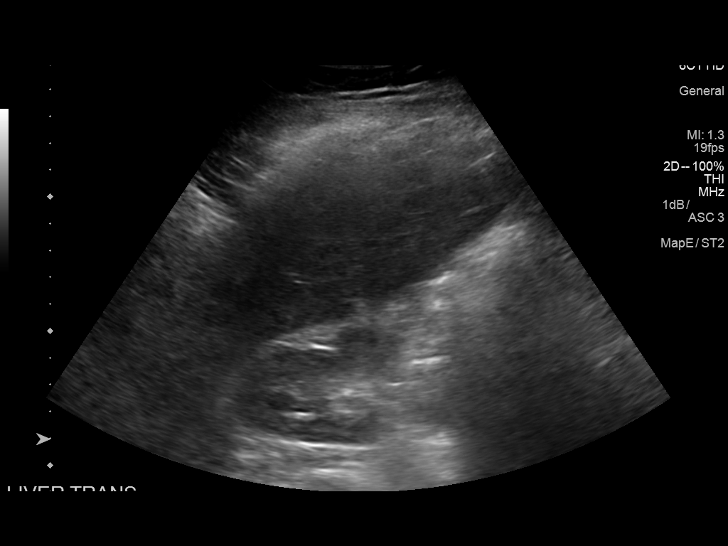

[14 of 25 positions shown; findings below may reference images not displayed]

FINDINGS: Gallbladder:

No gallstones or wall thickening visualized. No sonographic Murphy
sign noted by sonographer.

Common bile duct:

Diameter: 3 mm which is within normal limits.

Liver:

No focal lesion identified. Within normal limits in parenchymal
echogenicity. Portal vein is patent on color Doppler imaging with
normal direction of blood flow towards the liver.
IMPRESSION: No abnormality seen in the right upper quadrant of the abdomen.

## 2019-12-30 IMAGING — NM NM HEPATO W/GB/PHARM/[PERSON_NAME]
3 series · 13 of 13 positions shown · non-contrast
Comparison: Ultrasound 10/06/2018

CLINICAL DATA: Right upper quadrant pain

EXAM:
NUCLEAR MEDICINE HEPATOBILIARY IMAGING WITH GALLBLADDER EF
TECHNIQUE: Sequential images of the abdomen were obtained [DATE] minutes
following intravenous administration of radiopharmaceutical. After
oral ingestion of Ensure, gallbladder ejection fraction was
determined. At 60 min, normal ejection fraction is greater than 33%.
RADIOPHARMACEUTICALS:  5.1 mCi Kc-33m  Choletec IV

[he hepatobiliary · 4.52mm/px · 6 of 60 frames shown (1 of 3)]
[frame 6/60]
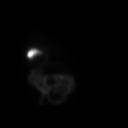
[frame 16/60]
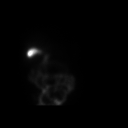
[frame 26/60]
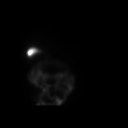
[frame 36/60]
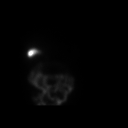
[frame 46/60]
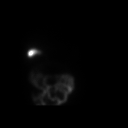
[frame 56/60]
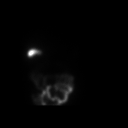

[he hepatobiliary · 2.26mm/px · 1 of 1 slices shown (2 of 3)]
[im 1/1]
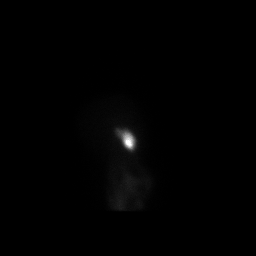

[he hepatobiliary · 4.52mm/px · 6 of 60 frames shown (3 of 3)]
[frame 6/60]
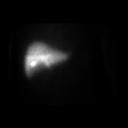
[frame 16/60]
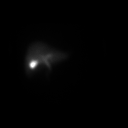
[frame 26/60]
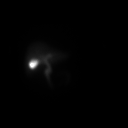
[frame 36/60]
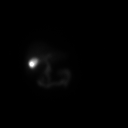
[frame 46/60]
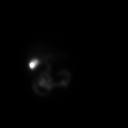
[frame 56/60]
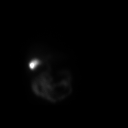

[13 of 13 positions shown; findings below may reference images not displayed]

FINDINGS: Prompt uptake and biliary excretion of activity by the liver is
seen. Gallbladder activity is visualized, consistent with patency of
cystic duct. Biliary activity passes into small bowel, consistent
with patent common bile duct.

Calculated gallbladder ejection fraction is 35%. (Normal gallbladder
ejection fraction with Ensure is greater than 33%.)
IMPRESSION: Negative examination
